# Patient Record
Sex: Female | Born: 1950 | Race: Black or African American | Hispanic: No | Marital: Single | Smoking: Never smoker
Health system: Southern US, Community
[De-identification: ages and names within clinical notes are randomized; demographics above are authoritative.]

## PROBLEM LIST (undated history)

## (undated) DIAGNOSIS — I2510 Atherosclerotic heart disease of native coronary artery without angina pectoris: Secondary | ICD-10-CM

## (undated) DIAGNOSIS — E119 Type 2 diabetes mellitus without complications: Secondary | ICD-10-CM

## (undated) HISTORY — PX: CHOLECYSTECTOMY: SHX55

## (undated) HISTORY — PX: BACK SURGERY: SHX140

## (undated) HISTORY — PX: ABDOMINAL HYSTERECTOMY: SHX81

---

## 2012-05-30 HISTORY — PX: GASTRIC BYPASS: SHX52

## 2016-10-18 ENCOUNTER — Ambulatory Visit (HOSPITAL_COMMUNITY)
Admission: EM | Admit: 2016-10-18 | Discharge: 2016-10-18 | Disposition: A | Discharge status: Home / Self Care | Payer: Medicare HMO | Attending: Family Medicine | Admitting: Family Medicine

## 2016-10-18 ENCOUNTER — Encounter (HOSPITAL_COMMUNITY): Payer: Self-pay | Admitting: Emergency Medicine

## 2016-10-18 ENCOUNTER — Ambulatory Visit (INDEPENDENT_AMBULATORY_CARE_PROVIDER_SITE_OTHER): Payer: Medicare HMO

## 2016-10-18 DIAGNOSIS — K5901 Slow transit constipation: Secondary | ICD-10-CM

## 2016-10-18 HISTORY — DX: Type 2 diabetes mellitus without complications: E11.9

## 2016-10-18 HISTORY — DX: Atherosclerotic heart disease of native coronary artery without angina pectoris: I25.10

## 2016-10-18 MED ORDER — POLYETHYLENE GLYCOL 3350 17 GM/SCOOP PO POWD: 1.0000 | Freq: Once | ORAL | 0 refills | Status: AC

## 2016-10-18 NOTE — ED Triage Notes (Signed)
[facility] with a complaint of upper left abdominal pain x 2 days. The patient denied N/V/D but did report constipation.

## 2016-10-18 NOTE — ED Provider Notes (Signed)
[facility]    CSN: [id number] Arrival date & time: 10/18/16  1554     History   Chief Complaint Chief Complaint  Patient presents with  . Abdominal Pain    HPI Kellie Brewer is a 66 y.o. female.   [facility] a [facility] of upper left abdominal pain x 2 days. The patient denied N/V/D but did report constipation.  Patient denies any chest pain or shortness of breath. She's had left upper quadrant pain before related to the constipation. She takes fiber tablets and was not aware that she is a drink more water with them.      Past Medical History:  Diagnosis Date  . Coronary artery disease   . Diabetes mellitus without complication (HCC)     There are no active problems to display for this patient.   Past Surgical History:  Procedure Laterality Date  . ABDOMINAL HYSTERECTOMY    . BACK SURGERY    . CHOLECYSTECTOMY    . GASTRIC BYPASS  2014    OB History    No data available       Home Medications    Prior to Admission medications   Medication Sig Start Date End Date Taking? Authorizing Provider  glipiZIDE (GLUCOTROL) 10 MG tablet Take 10 mg by mouth daily before breakfast.   Yes [provider]  pravastatin (PRAVACHOL) 20 MG tablet Take 20 mg by mouth daily.   Yes [provider]  traMADol (ULTRAM) 50 MG tablet Take by mouth every 6 (six) hours as needed.   Yes [provider]  polyethylene glycol powder (MIRALAX) powder Take 255 g by mouth once. 10/18/16 10/18/16  Elvina SidleLauenstein, Tameah Mihalko, MD    Family History History reviewed. No pertinent family history.  Social History Social History  Substance Use Topics  . Smoking status: Never Smoker  . Smokeless tobacco: Never Used  . Alcohol use No     Allergies   Penicillins   Review of Systems Review of Systems  Gastrointestinal: Positive for abdominal pain and constipation.  All other systems reviewed and are negative.    Physical  Exam Triage Vital Signs ED Triage Vitals  Enc Vitals Group     BP 10/18/16 1613 117/65     Pulse Rate 10/18/16 1613 (!) 136     Resp 10/18/16 1613 18     Temp 10/18/16 1613 98.3 F (36.8 C)     Temp Source 10/18/16 1613 Oral     SpO2 10/18/16 1613 100 %     Weight --      Height --      Head Circumference --      Peak Flow --      Pain Score 10/18/16 1609 6     Pain Loc --      Pain Edu? --      Excl. in GC? --    No data found.   Updated Vital Signs BP 117/65 (BP Location: Right Arm)   Pulse (!) 136   Temp 98.3 F (36.8 C) (Oral)   Resp 18   SpO2 100%    Physical Exam  Constitutional: She is oriented to person, place, and time. She appears well-developed and well-nourished.  HENT:  Right Ear: External ear normal.  Left Ear: External ear normal.  Mouth/Throat: Oropharynx is clear and moist.  Eyes: Conjunctivae and EOM are normal. Pupils are equal, round, and reactive to light.  Neck: Normal range of motion. Neck supple.  Cardiovascular: Normal rate, regular rhythm and normal heart sounds.   Pulse rechecked both by auscultation and palpation of radial pulse: 66 bpm  Pulmonary/Chest: Effort normal and breath sounds normal.  Abdominal: Soft. Bowel sounds are normal. There is tenderness.  Mildly tender left upper quadrant with deep palpation  Musculoskeletal: Normal range of motion.  Neurological: She is alert and oriented to person, place, and time.  Skin: Skin is warm and dry.  Nursing note and vitals reviewed.    UC Treatments / Results  Labs (all labs ordered are listed, but only abnormal results are displayed) Labs Reviewed - No data to display  EKG  EKG Interpretation None       Radiology Dg Abd 1 View  Result Date: 10/18/2016 CLINICAL DATA:  Left upper quadrant pain with constipation EXAM: ABDOMEN - 1 VIEW COMPARISON:  None. FINDINGS: Surgical clips in the right upper quadrant. Nonobstructed gas pattern with large amount of stool throughout the  colon. Probable postsurgical changes left hemiabdomen. Calcified pelvic phleboliths. IMPRESSION: Nonobstructed bowel gas pattern with large amount of stool in the colon. Electronically Signed   By: Jasmine Pang M.D.   On: 10/18/2016 16:40    Procedures Procedures (including critical care time)  Medications Ordered in UC Medications - No data to display   Initial Impression / Assessment and Plan / UC Course  I have reviewed the triage vital signs and the nursing notes.  Pertinent labs & imaging results that were available during my care of the patient were reviewed by me and considered in my medical decision making (see chart for details).     Final Clinical Impressions(s) / UC Diagnoses   Final diagnoses:  Slow transit constipation    New Prescriptions New Prescriptions   POLYETHYLENE GLYCOL POWDER (MIRALAX) POWDER    Take 255 g by mouth once.     Elvina Sidle, MD 10/18/16 1649

## 2016-10-18 NOTE — Discharge Instructions (Signed)
Please take a fleets enema this evening and take the Miralax twice daily for the next week.

## 2017-10-27 IMAGING — DX DG ABDOMEN 1V
1 series · 1 of 1 positions shown · non-contrast
Comparison: None.

CLINICAL DATA: Left upper quadrant pain with constipation

EXAM:
ABDOMEN - 1 VIEW

[abdomen kub]
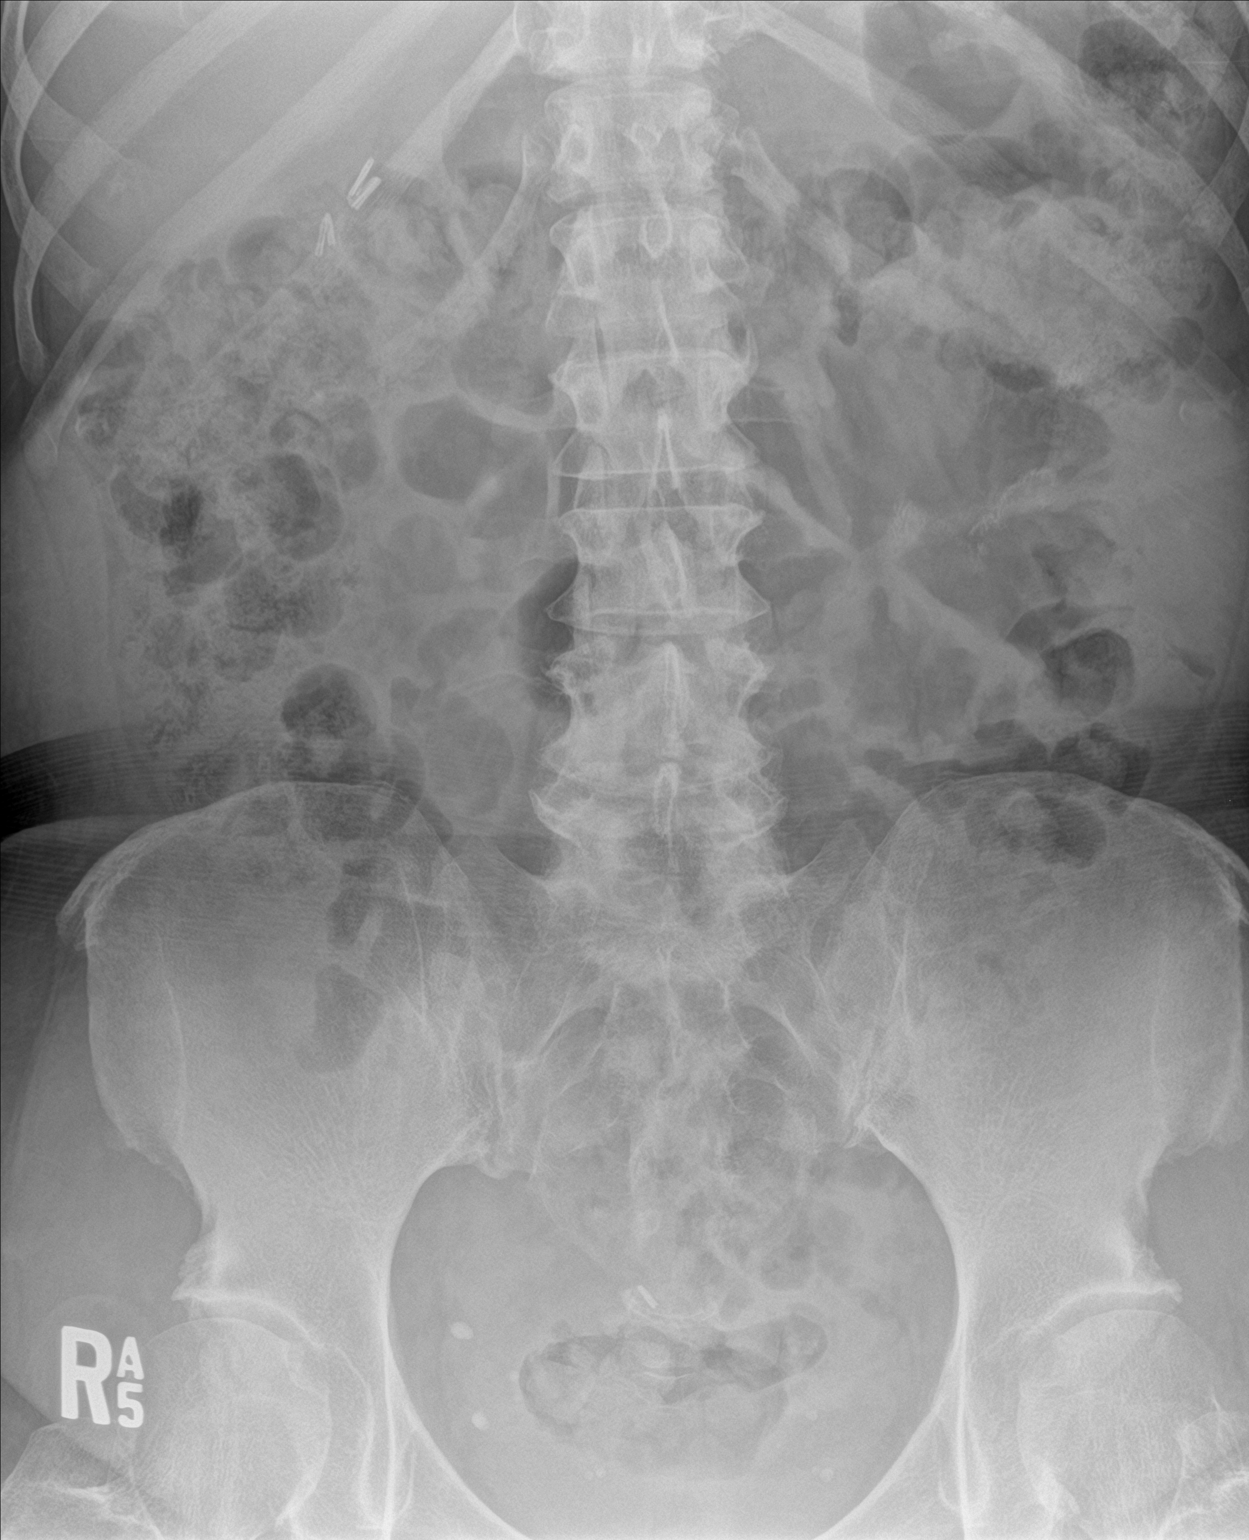

[1 of 1 positions shown; findings below may reference images not displayed]

FINDINGS: Surgical clips in the right upper quadrant. Nonobstructed gas
pattern with large amount of stool throughout the colon. Probable
postsurgical changes left hemiabdomen. Calcified pelvic phleboliths.
IMPRESSION: Nonobstructed bowel gas pattern with large amount of stool in the
colon.
